# Patient Record
Sex: Female | Born: 1990 | Race: White | Hispanic: No | Marital: Single | Smoking: Never smoker
Health system: Southern US, Community
[De-identification: ages and names within clinical notes are randomized; demographics above are authoritative.]

## PROBLEM LIST (undated history)

## (undated) DIAGNOSIS — D594 Other nonautoimmune hemolytic anemias: Secondary | ICD-10-CM

## (undated) DIAGNOSIS — N390 Urinary tract infection, site not specified: Secondary | ICD-10-CM

## (undated) HISTORY — PX: WISDOM TOOTH EXTRACTION: SHX21

## (undated) HISTORY — DX: Other nonautoimmune hemolytic anemias: D59.4

---
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
## 2011-01-26 NOTE — Assessment & Plan Note (Signed)
[facility]                            [facility] OFFICE NOTE   NAME:Faith Bailey, Faith Bailey                        MRN:          [id number]  DATE:11/22/2006                            DOB:          17-Dec-1990    Tymia is a 20 year old female brought in by her mother for new patient  evaluation and for evaluation of a knot on the left side of her neck for  6 months.   PAST MEDICAL HISTORY:  Past hospitalizations:  None.  Outpatient  surgery:  None.  Illnesses:  None.  Injuries:  She had a fractured left  clavicle x2, a fractured left elbow, all recovered, no sequelae.   ALLERGIES:  None.   She has no smoking, drinking alcohol or using any drugs.  She takes no  medications.   SOCIAL HISTORY:  She is ninth grade at [employer], doing well, A  student.   REVIEW OF SYSTEMS:  She has headaches.  She says they always occur 2-3  days before her period.  She describes them as __________, 3-4 on a  scale of 1 to 10.  She has some photophobia, some phonophobia.  No  nausea, vomiting or diarrhea.  She thought they were tension.  They  actually are migraine.  She has no aura.  Eyes, ears, nose and throat  were negative.  She gets regular dental care.  Currently has braces.  CARDIOPULMONARY:  Negative.  GI:  Negative.  GU:  Negative.  GYN:  Gravida 0, para 0.  Menses are regular, 3-4 days.  Not sexually active.  ENDO:  Negative.  Weight steady.  MUSCULOSKELETAL:  Negative.  VASCULAR:  Negative.  She does have allergic rhinitis, seasonal.  Because she has  light skin and light eyes, I advised her to wear skin screens.   SOCIAL HISTORY:  As above.   FAMILY HISTORY:  Father has had an MI.  He has known  hypercholesterolemia, was a smoker and has high blood pressure.  Mother  is in good health with no problems.   VACCINATION HISTORY:  She is up on her basic shots.  She needs a DTaP  and we will discuss Gardasil.   PROBLEM:  Knot on her neck.  The patient  has noted a knot on the left  side of her neck for 6 months.  It is about the size of a BB, has not  increased in size.  She has, otherwise, been well.   OBJECTIVE:  VITAL SIGNS:  Stable.  She is afebrile.  Examination of the neck shows a BB-sized mass.  It is soft.  It is  movable.  The rest of the lymph nodes are normal.  Thyroid was normal.   IMPRESSION:  1. Benign adenopathy versus cyst, probable cyst because it is outside      the area of the lymph nodes.  Plan:  Advised to observe.  If      increase in size, return.  2. Migraine headaches.  We discussed these are migraine.  We discussed      varied options including Motrin 400-600  mg stat versus Zomig.  She      will try the multiple medications.  I gave her a note to take Zomig      at work p.r.n.  It is 5 mg.  We will give it a 2-3 month trial and      see how that works and we will go from there.  3. Vaccination.  She will be given a DTaP, the Adacel, and we      discussed the pros and cons of Gardasil.  She wants that;      therefore, we gave her her first Gardasil.   Thirty minutes was spent going through her history and discussing the  above issues.     Jeffrey A. Tawanna Cooler, MD  Electronically Signed    JAT/MedQ  DD: 11/25/2006  DT: 11/25/2006  Job #: [id number]

## 2011-03-19 ENCOUNTER — Emergency Department (HOSPITAL_COMMUNITY)
Admission: EM | Admit: 2011-03-19 | Discharge: 2011-03-20 | Disposition: A | Discharge status: Home / Self Care | Payer: Worker's Compensation | Attending: Emergency Medicine | Admitting: Emergency Medicine

## 2011-03-19 ENCOUNTER — Emergency Department (HOSPITAL_COMMUNITY): Payer: Worker's Compensation

## 2011-03-19 DIAGNOSIS — S61509A Unspecified open wound of unspecified wrist, initial encounter: Secondary | ICD-10-CM | POA: Insufficient documentation

## 2011-03-19 DIAGNOSIS — M25539 Pain in unspecified wrist: Secondary | ICD-10-CM | POA: Insufficient documentation

## 2011-03-19 DIAGNOSIS — W260XXA Contact with knife, initial encounter: Secondary | ICD-10-CM | POA: Insufficient documentation

## 2011-03-22 ENCOUNTER — Encounter: Payer: Self-pay | Admitting: Family Medicine

## 2011-03-27 ENCOUNTER — Ambulatory Visit: Payer: 59 | Admitting: Family Medicine

## 2011-07-31 ENCOUNTER — Other Ambulatory Visit: Payer: Self-pay | Admitting: Family Medicine

## 2012-06-25 ENCOUNTER — Ambulatory Visit (INDEPENDENT_AMBULATORY_CARE_PROVIDER_SITE_OTHER): Payer: 59 | Admitting: Family Medicine

## 2012-06-25 ENCOUNTER — Encounter: Payer: Self-pay | Admitting: Family Medicine

## 2012-06-25 VITALS — BP 110/70 | Temp 98.3°F | Ht 69.0 in | Wt 119.0 lb

## 2012-06-25 DIAGNOSIS — Z23 Encounter for immunization: Secondary | ICD-10-CM

## 2012-06-25 DIAGNOSIS — L309 Dermatitis, unspecified: Secondary | ICD-10-CM

## 2012-06-25 DIAGNOSIS — L259 Unspecified contact dermatitis, unspecified cause: Secondary | ICD-10-CM

## 2012-06-25 MED ORDER — TRIAMCINOLONE ACETONIDE 0.025 % EX OINT: TOPICAL_OINTMENT | Freq: Two times a day (BID) | CUTANEOUS | Status: DC

## 2012-06-25 NOTE — Patient Instructions (Signed)
Apply small amounts of the triamcinolone gel twice daily as needed  Return next Tuesday at 8:15 for general checkup no labs

## 2012-06-25 NOTE — Progress Notes (Signed)
  Subjective:    Patient ID: Faith Bailey, female    DOB: 1990-11-16, 21 y.o.   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 She does have a history of underlying allergic rhinitis.   Review of Systems    general and dermatologic review of systems otherwise negative Objective:   Physical Exam  Well-developed well-nourished female no acute distress examination of the hands show some mild dyshidrotic eczema      Assessment & Plan:  Dyshidrotic eczema plan triamcinolone gel twice a day when necessary

## 2012-07-01 ENCOUNTER — Other Ambulatory Visit (HOSPITAL_COMMUNITY)
Admission: RE | Admit: 2012-07-01 | Discharge: 2012-07-01 | Disposition: A | Discharge status: Home / Self Care | Payer: 59 | Source: Ambulatory Visit | Attending: Family Medicine | Admitting: Family Medicine

## 2012-07-01 ENCOUNTER — Encounter: Payer: Self-pay | Admitting: Family Medicine

## 2012-07-01 ENCOUNTER — Ambulatory Visit (INDEPENDENT_AMBULATORY_CARE_PROVIDER_SITE_OTHER): Payer: 59 | Admitting: Family Medicine

## 2012-07-01 VITALS — BP 122/82 | Temp 97.1°F | Ht 69.0 in | Wt 118.0 lb

## 2012-07-01 DIAGNOSIS — L309 Dermatitis, unspecified: Secondary | ICD-10-CM

## 2012-07-01 DIAGNOSIS — Z01419 Encounter for gynecological examination (general) (routine) without abnormal findings: Secondary | ICD-10-CM | POA: Insufficient documentation

## 2012-07-01 DIAGNOSIS — Z0000 Encounter for general adult medical examination without abnormal findings: Secondary | ICD-10-CM

## 2012-07-01 DIAGNOSIS — N6311 Unspecified lump in the right breast, upper outer quadrant: Secondary | ICD-10-CM

## 2012-07-01 DIAGNOSIS — N938 Other specified abnormal uterine and vaginal bleeding: Secondary | ICD-10-CM

## 2012-07-01 DIAGNOSIS — L259 Unspecified contact dermatitis, unspecified cause: Secondary | ICD-10-CM

## 2012-07-01 DIAGNOSIS — J309 Allergic rhinitis, unspecified: Secondary | ICD-10-CM

## 2012-07-01 DIAGNOSIS — N63 Unspecified lump in unspecified breast: Secondary | ICD-10-CM

## 2012-07-01 DIAGNOSIS — N949 Unspecified condition associated with female genital organs and menstrual cycle: Secondary | ICD-10-CM

## 2012-07-01 DIAGNOSIS — Z1151 Encounter for screening for human papillomavirus (HPV): Secondary | ICD-10-CM | POA: Insufficient documentation

## 2012-07-01 DIAGNOSIS — F410 Panic disorder [episodic paroxysmal anxiety] without agoraphobia: Secondary | ICD-10-CM

## 2012-07-01 DIAGNOSIS — R87810 Cervical high risk human papillomavirus (HPV) DNA test positive: Secondary | ICD-10-CM | POA: Insufficient documentation

## 2012-07-01 MED ORDER — LORAZEPAM 0.5 MG PO TABS: 0.5000 mg | ORAL_TABLET | Freq: Two times a day (BID) | ORAL | Status: DC | PRN

## 2012-07-01 NOTE — Progress Notes (Signed)
  Subjective:    Patient ID: Faith Bailey, female    DOB: [birth year], 21 y.o.   MRN: [id number]  HPI Faith Bailey is a 21 yo single female nonsmoker who comes in today for general physical examination  She's always been in excellent health she's had no chronic health problems. She takes Zyrtec and Flonase for allergic rhinitis, Kenalog for eczema and her BCPs.  She has had episodes for about 4 years since her father died with intermittent panic attacks. She describes these as episodic maybe once or twice a month. They began with tightness in the chest shortness of breath and rapid heart rate. No history of cardiac disease no history of panic attacks run in the family.  Her boyfriend is in rehabilitation [facility] hospital 2 months following a accident. She is working here at [employer] in [place]   Review of Systems  Constitutional: Negative.   HENT: Negative.   Eyes: Negative.   Respiratory: Negative.   Cardiovascular: Negative.   Gastrointestinal: Negative.   Genitourinary: Negative.   Musculoskeletal: Negative.   Neurological: Negative.   Hematological: Negative.   Psychiatric/Behavioral: Negative.        Objective:   Physical Exam  Constitutional: She appears well-developed and well-nourished.  HENT:  Head: Normocephalic and atraumatic.  Right Ear: External ear normal.  Left Ear: External ear normal.  Nose: Nose normal.  Mouth/Throat: Oropharynx is clear and moist.  Eyes: EOM are normal. Pupils are equal, round, and reactive to light.  Neck: Normal range of motion. Neck supple. No thyromegaly present.  Cardiovascular: Normal rate, regular rhythm, normal heart sounds and intact distal pulses.  Exam reveals no gallop and no friction rub.   No murmur heard. Pulmonary/Chest: Effort normal and breath sounds normal.  Abdominal: Soft. Bowel sounds are normal. She exhibits no distension and no mass. There is no tenderness. There is no rebound.  Genitourinary: Vagina normal and  uterus normal. Guaiac negative stool. No vaginal discharge found.  Musculoskeletal: Normal range of motion.  Lymphadenopathy:    She has no cervical adenopathy.  Neurological: She is alert. She has normal reflexes. No cranial nerve deficit. She exhibits normal muscle tone. Coordination normal.  Skin: Skin is warm and dry.  Psychiatric: She has a normal mood and affect. Her behavior is normal. Judgment and thought content normal.          Assessment & Plan:  Healthy female  Panic attacks,,,,,,,,,, since they're episodic once or twice a month we'll give her low-dose Ativan  DU B. continue BCPs  Allergic rhinitis continue medication  Eczema continue steroid cream return in one year sooner if any problems

## 2012-07-01 NOTE — Patient Instructions (Signed)
Continue your current medications  Ativan 0.5.................Marland Kitchen 1 at the onset of the panic attack  Return in one year sooner if any problems

## 2012-08-06 ENCOUNTER — Other Ambulatory Visit: Payer: Self-pay | Admitting: Family Medicine

## 2012-11-17 ENCOUNTER — Emergency Department (HOSPITAL_COMMUNITY)
Admission: EM | Admit: 2012-11-17 | Discharge: 2012-11-17 | Disposition: A | Discharge status: Home / Self Care | Payer: 59 | Source: Home / Self Care

## 2012-11-17 ENCOUNTER — Encounter (HOSPITAL_COMMUNITY): Payer: Self-pay

## 2012-11-17 ENCOUNTER — Emergency Department (INDEPENDENT_AMBULATORY_CARE_PROVIDER_SITE_OTHER): Payer: 59

## 2012-11-17 DIAGNOSIS — W19XXXA Unspecified fall, initial encounter: Secondary | ICD-10-CM

## 2012-11-17 DIAGNOSIS — S20229A Contusion of unspecified back wall of thorax, initial encounter: Secondary | ICD-10-CM

## 2012-11-17 DIAGNOSIS — T148XXA Other injury of unspecified body region, initial encounter: Secondary | ICD-10-CM

## 2012-11-17 MED ORDER — CYCLOBENZAPRINE HCL 10 MG PO TABS: 10.0000 mg | ORAL_TABLET | Freq: Three times a day (TID) | ORAL | Status: DC | PRN

## 2012-11-17 MED ORDER — IBUPROFEN 600 MG PO TABS: 600.0000 mg | ORAL_TABLET | Freq: Three times a day (TID) | ORAL | Status: DC

## 2012-11-17 NOTE — ED Notes (Signed)
States that on 3-9, she was in her stocking feet, descending wooden stairs, when she slipped, and fell aprox 5 steps, landed  on her back ; c/o pain in back from sacrum to mid t-spine area, small abrasion present ; denies loc

## 2012-11-17 NOTE — ED Provider Notes (Signed)
History     CSN: [id number]  Arrival date & time 11/17/12  1309   None     Chief Complaint  Patient presents with  . Fall    (Consider location/radiation/quality/duration/timing/severity/associated sxs/prior treatment) HPI Comments: Pt slipped going down wooden stairs while wearing slippery socks, fell backward and landed on stairs on back.  C/o pain to upper and lower back, mostly lower back.   Patient is a 22 y.o. female presenting with fall. The history is provided by the patient.  Fall The accident occurred yesterday. Fall occurred: walking down stairs carrying squirming puppy. She fell from a height of 1 to 2 ft. Impact surface: fell onto stairs. Point of impact: back. Pain location: lower and upper back. The pain is at a severity of 7/10. The pain is moderate. She was ambulatory at the scene. Pertinent negatives include no numbness, no abdominal pain, no bowel incontinence, no vomiting, no loss of consciousness and no tingling. The symptoms are aggravated by activity. She has tried nothing for the symptoms.    Past Medical History  Diagnosis Date  . MHA (microangiopathic hemolytic anemia)     History reviewed. No pertinent past surgical history.  Family History  Problem Relation Age of Onset  . Diabetes Other   . Hypertension Other   . Heart disease Other     History  Substance Use Topics  . Smoking status: Never Smoker   . Smokeless tobacco: Not on file  . Alcohol Use: Not on file    OB History   Grav Para Term Preterm Abortions TAB SAB Ect Mult Living                  Review of Systems  Gastrointestinal: Negative for vomiting, abdominal pain and bowel incontinence.  Musculoskeletal: Negative for gait problem.       Back injury  Skin: Positive for wound.  Neurological: Negative for tingling, loss of consciousness, weakness and numbness.    Allergies  Review of patient's allergies indicates no known allergies.  Home Medications   Current Outpatient  Rx  Name  Route  Sig  Dispense  Refill  . ZOVIA 1/35E, 28, 1-35 MG-MCG tablet      TAKE AS DIRECTED   84 tablet   5   . cetirizine (ZYRTEC) 10 MG tablet   Oral   Take 10 mg by mouth daily.           . cyclobenzaprine (FLEXERIL) 10 MG tablet   Oral   Take 1 tablet (10 mg total) by mouth 3 (three) times daily as needed for muscle spasms.   21 tablet   0   . fluticasone (FLONASE) 50 MCG/ACT nasal spray   Nasal   Place 2 sprays into the nose daily.           Marland Kitchen ibuprofen (ADVIL,MOTRIN) 600 MG tablet   Oral   Take 1 tablet (600 mg total) by mouth 3 (three) times daily.   21 tablet   0   . LORazepam (ATIVAN) 0.5 MG tablet   Oral   Take 1 tablet (0.5 mg total) by mouth 2 (two) times daily as needed for anxiety.   30 tablet   1   . triamcinolone (KENALOG) 0.025 % ointment   Topical   Apply topically 2 (two) times daily.   30 g   3     BP 125/76  Pulse 84  Temp(Src) 97.3 F (36.3 C) (Oral)  Resp 16  SpO2 99%  LMP  11/16/2012  Physical Exam  Constitutional: She appears well-developed and well-nourished. No distress.  Musculoskeletal:       Thoracic back: She exhibits tenderness. She exhibits no bony tenderness, no swelling and no deformity.       Lumbar back: She exhibits tenderness, bony tenderness and swelling.       Back:  Neurological: Coordination and gait normal.  Reflex Scores:      Patellar reflexes are 2+ on the right side and 2+ on the left side. Skin: Skin is warm and dry. Abrasion noted.    ED Course  Procedures (including critical care time)  Labs Reviewed - No data to display Dg Lumbar Spine Complete  11/17/2012  *RADIOLOGY REPORT*  Clinical Data: Larey Seat down steps last night with pain in the lower lumbar spine  LUMBAR SPINE - COMPLETE 4+ VIEW  Comparison: None.  Findings: The lumbar vertebrae are slightly straightened in alignment.  Intervertebral disc spaces appear normal.  No compression deformity is seen.  The SI joints are well corticated.   IMPRESSION: Straightened alignment.  No acute bony abnormality   Original Report Authenticated By: Dwyane Dee, M.D.      1. Fall at home, initial encounter   2. Abrasion   3. Contusion       MDM          Cathlyn Parsons, NP 11/17/12 1552

## 2013-03-11 ENCOUNTER — Encounter (HOSPITAL_COMMUNITY): Payer: Self-pay | Admitting: *Deleted

## 2013-03-11 ENCOUNTER — Emergency Department (HOSPITAL_COMMUNITY)
Admission: EM | Admit: 2013-03-11 | Discharge: 2013-03-11 | Disposition: A | Discharge status: Home / Self Care | Payer: 59 | Source: Home / Self Care | Attending: Family Medicine | Admitting: Family Medicine

## 2013-03-11 DIAGNOSIS — L42 Pityriasis rosea: Secondary | ICD-10-CM

## 2013-03-11 NOTE — ED Notes (Signed)
Pt  Reports    She  Noticed   Crusty  Patches   On abd      And  Back        X  10  Days  The  Rash   Started  Out on  abd  The  Rash  Itches         -  No  Known causative  Agents  Except  Having  Been at the  [facility]

## 2013-03-11 NOTE — ED Provider Notes (Signed)
History    CSN: [id number] Arrival date & time 03/11/13  1028  First MD Initiated Contact with Patient 03/11/13 1241     Chief Complaint  Patient presents with  . Rash   (Consider location/radiation/quality/duration/timing/severity/associated sxs/prior Treatment) Patient is a 22 y.o. female presenting with rash. The history is provided by the patient. No language interpreter was used.  Rash Pain location:  Generalized Pain severity:  No pain Timing:  Constant Progression:  Worsening Chronicity:  New Relieved by:  Nothing Worsened by:  Nothing tried Pt complains of a rash.   It started with one patch on abdomen Past Medical History  Diagnosis Date  . MHA (microangiopathic hemolytic anemia)    History reviewed. No pertinent past surgical history. Family History  Problem Relation Age of Onset  . Diabetes Other   . Hypertension Other   . Heart disease Other    History  Substance Use Topics  . Smoking status: Never Smoker   . Smokeless tobacco: Not on file  . Alcohol Use: No   OB History   Grav Para Term Preterm Abortions TAB SAB Ect Mult Living                 Review of Systems  Skin: Positive for rash.  All other systems reviewed and are negative.    Allergies  Review of patient's allergies indicates no known allergies.  Home Medications   Current Outpatient Rx  Name  Route  Sig  Dispense  Refill  . cetirizine (ZYRTEC) 10 MG tablet   Oral   Take 10 mg by mouth daily.           . cyclobenzaprine (FLEXERIL) 10 MG tablet   Oral   Take 1 tablet (10 mg total) by mouth 3 (three) times daily as needed for muscle spasms.   21 tablet   0   . fluticasone (FLONASE) 50 MCG/ACT nasal spray   Nasal   Place 2 sprays into the nose daily.           Marland Kitchen ibuprofen (ADVIL,MOTRIN) 600 MG tablet   Oral   Take 1 tablet (600 mg total) by mouth 3 (three) times daily.   21 tablet   0   . LORazepam (ATIVAN) 0.5 MG tablet   Oral   Take 1 tablet (0.5 mg total) by  mouth 2 (two) times daily as needed for anxiety.   30 tablet   1   . triamcinolone (KENALOG) 0.025 % ointment   Topical   Apply topically 2 (two) times daily.   30 g   3   . ZOVIA 1/35E, 28, 1-35 MG-MCG tablet      TAKE AS DIRECTED   84 tablet   5    BP 120/64  Pulse 72  Temp(Src) 98.6 F (37 C) (Oral)  Resp 14  SpO2 100%  LMP 02/24/2013 Physical Exam  Nursing note and vitals reviewed. Constitutional: She is oriented to person, place, and time. She appears well-developed and well-nourished.  HENT:  Head: Normocephalic and atraumatic.  Eyes: Pupils are equal, round, and reactive to light.  Musculoskeletal: Normal range of motion.  Neurological: She is alert and oriented to person, place, and time.  Skin: Rash noted.  Herald patch abdomen,  Multiple scattered small erythematous areas back, chest shoulders.  Psychiatric: She has a normal mood and affect.    ED Course  Procedures (including critical care time) Labs Reviewed - No data to display No results found. 1. Pityriasis rosea  MDM    Elson Areas, PA-C 03/11/13 1329  Lonia Skinner [place], PA-C 03/11/13 1331

## 2013-03-11 NOTE — ED Provider Notes (Signed)
Medical screening examination/treatment/procedure(s) were performed by non-physician practitioner and as supervising physician I was immediately available for consultation/collaboration.   MORENO-COLL,Kanin Lia; MD  Perlie Scheuring Moreno-Coll, MD 03/11/13 1716 

## 2013-09-11 ENCOUNTER — Other Ambulatory Visit: Payer: Self-pay | Admitting: Family Medicine

## 2013-10-05 ENCOUNTER — Other Ambulatory Visit: Payer: Self-pay | Admitting: Family Medicine

## 2013-11-13 ENCOUNTER — Other Ambulatory Visit (INDEPENDENT_AMBULATORY_CARE_PROVIDER_SITE_OTHER): Payer: 59

## 2013-11-13 DIAGNOSIS — Z0000 Encounter for general adult medical examination without abnormal findings: Secondary | ICD-10-CM

## 2013-11-13 LAB — LIPID PANEL
CHOL/HDL RATIO: 3
CHOLESTEROL: 153 mg/dL (ref 0–200)
HDL: 52.6 mg/dL (ref >39.00)
LDL Cholesterol: 86 mg/dL (ref 0–99)
TRIGLYCERIDES: 73 mg/dL (ref 0.0–149.0)
VLDL: 14.6 mg/dL (ref 0.0–40.0)

## 2013-11-13 LAB — POCT URINALYSIS DIPSTICK
Bilirubin, UA: NEGATIVE
GLUCOSE UA: NEGATIVE
Ketones, UA: NEGATIVE
NITRITE UA: NEGATIVE
PROTEIN UA: NEGATIVE
SPEC GRAV UA: 1.025
Urobilinogen, UA: 0.2
pH, UA: 5.5

## 2013-11-13 LAB — CBC WITH DIFFERENTIAL/PLATELET
BASOS ABS: 0 10*3/uL (ref 0.0–0.1)
Basophils Relative: 0.9 % (ref 0.0–3.0)
EOS ABS: 0.3 10*3/uL (ref 0.0–0.7)
EOS PCT: 5 % (ref 0.0–5.0)
HEMATOCRIT: 43.2 % (ref 36.0–46.0)
Hemoglobin: 14.9 g/dL (ref 12.0–15.0)
LYMPHS ABS: 1.6 10*3/uL (ref 0.7–4.0)
Lymphocytes Relative: 32.3 % (ref 12.0–46.0)
MCHC: 34.4 g/dL (ref 30.0–36.0)
MCV: 93.7 fl (ref 78.0–100.0)
Monocytes Absolute: 0.4 10*3/uL (ref 0.1–1.0)
Monocytes Relative: 8.2 % (ref 3.0–12.0)
NEUTROS PCT: 53.6 % (ref 43.0–77.0)
Neutro Abs: 2.7 10*3/uL (ref 1.4–7.7)
Platelets: 206 10*3/uL (ref 150.0–400.0)
RBC: 4.62 Mil/uL (ref 3.87–5.11)
RDW: 12.3 % (ref 11.5–14.6)
WBC: 5 10*3/uL (ref 4.5–10.5)

## 2013-11-13 LAB — BASIC METABOLIC PANEL
BUN: 14 mg/dL (ref 6–23)
CO2: 27 meq/L (ref 19–32)
CREATININE: 0.8 mg/dL (ref 0.4–1.2)
Calcium: 9.3 mg/dL (ref 8.4–10.5)
Chloride: 105 mEq/L (ref 96–112)
GFR: 102.58 mL/min (ref >60.00)
Glucose, Bld: 86 mg/dL (ref 70–99)
Potassium: 4.4 mEq/L (ref 3.5–5.1)
Sodium: 138 mEq/L (ref 135–145)

## 2013-11-13 LAB — HEPATIC FUNCTION PANEL
ALK PHOS: 50 U/L (ref 39–117)
ALT: 27 U/L (ref 0–35)
AST: 21 U/L (ref 0–37)
Albumin: 4.4 g/dL (ref 3.5–5.2)
BILIRUBIN TOTAL: 1.1 mg/dL (ref 0.3–1.2)
Bilirubin, Direct: 0.1 mg/dL (ref 0.0–0.3)
Total Protein: 7.3 g/dL (ref 6.0–8.3)

## 2013-11-13 LAB — TSH: TSH: 2.09 u[IU]/mL (ref 0.35–5.50)

## 2013-11-19 ENCOUNTER — Ambulatory Visit (INDEPENDENT_AMBULATORY_CARE_PROVIDER_SITE_OTHER): Payer: 59 | Admitting: Family Medicine

## 2013-11-19 ENCOUNTER — Encounter: Payer: Self-pay | Admitting: Family Medicine

## 2013-11-19 ENCOUNTER — Other Ambulatory Visit (HOSPITAL_COMMUNITY)
Admission: RE | Admit: 2013-11-19 | Discharge: 2013-11-19 | Disposition: A | Discharge status: Home / Self Care | Payer: 59 | Source: Ambulatory Visit | Attending: Family Medicine | Admitting: Family Medicine

## 2013-11-19 VITALS — BP 118/70 | HR 96 | Temp 98.1°F | Resp 18 | Ht 69.0 in | Wt 120.0 lb

## 2013-11-19 DIAGNOSIS — N925 Other specified irregular menstruation: Secondary | ICD-10-CM

## 2013-11-19 DIAGNOSIS — Z01419 Encounter for gynecological examination (general) (routine) without abnormal findings: Secondary | ICD-10-CM | POA: Insufficient documentation

## 2013-11-19 DIAGNOSIS — N949 Unspecified condition associated with female genital organs and menstrual cycle: Secondary | ICD-10-CM

## 2013-11-19 DIAGNOSIS — J309 Allergic rhinitis, unspecified: Secondary | ICD-10-CM

## 2013-11-19 DIAGNOSIS — Z124 Encounter for screening for malignant neoplasm of cervix: Secondary | ICD-10-CM

## 2013-11-19 DIAGNOSIS — N938 Other specified abnormal uterine and vaginal bleeding: Secondary | ICD-10-CM

## 2013-11-19 MED ORDER — ETHYNODIOL DIAC-ETH ESTRADIOL 1-35 MG-MCG PO TABS: ORAL_TABLET | ORAL | Status: DC

## 2013-11-19 NOTE — Progress Notes (Signed)
Pre-visit discussion using our clinic review tool. No additional management support is needed unless otherwise documented below in the visit note.  

## 2013-11-19 NOTE — Patient Instructions (Signed)
Remember to do a thorough breast exam monthly  Return in one year sooner if any problems  Continue your current medications

## 2013-11-19 NOTE — Progress Notes (Signed)
   Subjective:    Patient ID: Faith Bailey, female    DOB: 28-Sep-1990, 23 y.o.   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  She takes Zyrtec amikacin steroid nasal spray for allergic rhinitis  Her last year's physical he notices a cystic lesion left breast at 6:00. She thinks it's a little bigger and it tends to say sore a lot.   Review of Systems  Constitutional: Negative.   HENT: Negative.   Eyes: Negative.   Respiratory: Negative.   Cardiovascular: Negative.   Gastrointestinal: Negative.   Genitourinary: Negative.   Musculoskeletal: Negative.   Neurological: Negative.   Psychiatric/Behavioral: Negative.        Objective:   Physical Exam  Nursing note and vitals reviewed. Constitutional: She appears well-developed and well-nourished.  HENT:  Head: Normocephalic and atraumatic.  Right Ear: External ear normal.  Left Ear: External ear normal.  Nose: Nose normal.  Mouth/Throat: Oropharynx is clear and moist.  Eyes: EOM are normal. Pupils are equal, round, and reactive to light.  Neck: Normal range of motion. Neck supple. No thyromegaly present.  Cardiovascular: Normal rate, regular rhythm, normal heart sounds and intact distal pulses.  Exam reveals no gallop and no friction rub.   No murmur heard. Pulmonary/Chest: Effort normal and breath sounds normal.  Abdominal: Soft. Bowel sounds are normal. She exhibits no distension and no mass. There is no tenderness. There is no rebound.  Genitourinary: Vagina normal and uterus normal. Guaiac negative stool. No vaginal discharge found.  Bilateral breast exam shows both nipples are chronically inverted. There is a marble-sized lesion left breast 6:00 half an inch below the nipple. It soft rubbery and movable and tender  Musculoskeletal: Normal range of motion.    Lymphadenopathy:    She has no cervical adenopathy.  Neurological: She is alert. She has normal reflexes. No cranial nerve deficit. She exhibits normal muscle tone. Coordination normal.  Skin: Skin is warm and dry.  Psychiatric: She has a normal mood and affect. Her behavior is normal. Judgment and thought content normal.          Assessment & Plan:  Healthy female  Dysfunction uterine bleeding continue BCPs  Allergic rhinitis continue Zyrtec  Cystic lesion left breast observe

## 2014-11-01 ENCOUNTER — Ambulatory Visit: Payer: Self-pay | Admitting: Family Medicine

## 2014-11-09 ENCOUNTER — Other Ambulatory Visit: Payer: Self-pay | Admitting: Family Medicine

## 2014-11-11 ENCOUNTER — Other Ambulatory Visit: Payer: Self-pay | Admitting: Family Medicine

## 2014-11-12 MED ORDER — ETHYNODIOL DIAC-ETH ESTRADIOL 1-35 MG-MCG PO TABS: ORAL_TABLET | ORAL | Status: DC

## 2015-05-09 ENCOUNTER — Encounter (HOSPITAL_COMMUNITY): Payer: Self-pay | Admitting: *Deleted

## 2015-05-09 ENCOUNTER — Inpatient Hospital Stay (HOSPITAL_COMMUNITY)
Admission: AD | Admit: 2015-05-09 | Discharge: 2015-05-09 | Disposition: A | Discharge status: Home / Self Care | Payer: BLUE CROSS/BLUE SHIELD | Source: Ambulatory Visit | Attending: Family Medicine | Admitting: Family Medicine

## 2015-05-09 ENCOUNTER — Inpatient Hospital Stay (HOSPITAL_COMMUNITY): Payer: BLUE CROSS/BLUE SHIELD

## 2015-05-09 DIAGNOSIS — Z833 Family history of diabetes mellitus: Secondary | ICD-10-CM | POA: Insufficient documentation

## 2015-05-09 DIAGNOSIS — R1031 Right lower quadrant pain: Secondary | ICD-10-CM | POA: Diagnosis present

## 2015-05-09 DIAGNOSIS — Z8249 Family history of ischemic heart disease and other diseases of the circulatory system: Secondary | ICD-10-CM | POA: Diagnosis not present

## 2015-05-09 DIAGNOSIS — N8320 Unspecified ovarian cysts: Secondary | ICD-10-CM | POA: Insufficient documentation

## 2015-05-09 DIAGNOSIS — K5900 Constipation, unspecified: Secondary | ICD-10-CM | POA: Diagnosis not present

## 2015-05-09 DIAGNOSIS — N83202 Unspecified ovarian cyst, left side: Secondary | ICD-10-CM

## 2015-05-09 DIAGNOSIS — R109 Unspecified abdominal pain: Secondary | ICD-10-CM

## 2015-05-09 HISTORY — DX: Urinary tract infection, site not specified: N39.0

## 2015-05-09 LAB — COMPREHENSIVE METABOLIC PANEL
ALK PHOS: 33 U/L — AB (ref 38–126)
ALT: 12 U/L — AB (ref 14–54)
AST: 17 U/L (ref 15–41)
Albumin: 3.6 g/dL (ref 3.5–5.0)
Anion gap: 7 (ref 5–15)
BUN: 11 mg/dL (ref 6–20)
CALCIUM: 8.6 mg/dL — AB (ref 8.9–10.3)
CO2: 22 mmol/L (ref 22–32)
CREATININE: 0.7 mg/dL (ref 0.44–1.00)
Chloride: 109 mmol/L (ref 101–111)
Glucose, Bld: 79 mg/dL (ref 65–99)
Potassium: 3.9 mmol/L (ref 3.5–5.1)
Sodium: 138 mmol/L (ref 135–145)
TOTAL PROTEIN: 6.4 g/dL — AB (ref 6.5–8.1)
Total Bilirubin: 0.6 mg/dL (ref 0.3–1.2)

## 2015-05-09 LAB — CBC
HCT: 36.7 % (ref 36.0–46.0)
HEMOGLOBIN: 12.9 g/dL (ref 12.0–15.0)
MCH: 32.9 pg (ref 26.0–34.0)
MCHC: 35.1 g/dL (ref 30.0–36.0)
MCV: 93.6 fL (ref 78.0–100.0)
Platelets: 164 10*3/uL (ref 150–400)
RBC: 3.92 MIL/uL (ref 3.87–5.11)
RDW: 12.5 % (ref 11.5–15.5)
WBC: 4.8 10*3/uL (ref 4.0–10.5)

## 2015-05-09 LAB — URINALYSIS, ROUTINE W REFLEX MICROSCOPIC
BILIRUBIN URINE: NEGATIVE
Glucose, UA: NEGATIVE mg/dL
HGB URINE DIPSTICK: NEGATIVE
Ketones, ur: 15 mg/dL — AB
Leukocytes, UA: NEGATIVE
Nitrite: NEGATIVE
PH: 5.5 (ref 5.0–8.0)
Protein, ur: NEGATIVE mg/dL
SPECIFIC GRAVITY, URINE: 1.02 (ref 1.005–1.030)
Urobilinogen, UA: 0.2 mg/dL (ref 0.0–1.0)

## 2015-05-09 LAB — POCT PREGNANCY, URINE: Preg Test, Ur: NEGATIVE

## 2015-05-09 MED ORDER — KETOROLAC TROMETHAMINE 60 MG/2ML IM SOLN
60.0000 mg | Freq: Once | INTRAMUSCULAR | Status: AC
  Administered 2015-05-09: 60 mg via INTRAMUSCULAR
  Filled 2015-05-09: qty 2

## 2015-05-09 MED ORDER — IBUPROFEN 600 MG PO TABS: 600.0000 mg | ORAL_TABLET | Freq: Four times a day (QID) | ORAL | Status: DC | PRN

## 2015-05-09 NOTE — MAU Note (Signed)
Pt states she was dx'd with ovarian cyst in [facility], was given phenergan & oxycodone.  Last took oxycodone last night.

## 2015-05-09 NOTE — MAU Provider Note (Signed)
History     CSN: [id number]  Arrival date and time: 05/09/15 4098   First Provider Initiated Contact with Patient 05/09/15 0813       Chief Complaint  Patient presents with  . Abdominal Pain   HPI  Faith Bailey is a 24 y.o. nonpregnant female patient who presents with abdominal pain. Pain started last Thursday while at [facility]; was seen at hospital there and diagnosed with ovarian cyst.  Pain worsened last night. RLQ pain that is sharp, lower suprapubic pain that is burning and dull low back pain. Rates pain 8/10. Took Norco last night (prescribed by other hospital) with minimal relief.  Denies vaginal bleeding or vaginal discharge. Denies fever.  Some nausea, no vomiting.  Last BM was last Thursday; has been taking OTC stool softener with no relief.   Past Medical History  Diagnosis Date  . MHA (microangiopathic hemolytic anemia)   . UTI (urinary tract infection)     Past Surgical History  Procedure Laterality Date  . Wisdom tooth extraction      Family History  Problem Relation Age of Onset  . Diabetes Other   . Hypertension Other   . Heart disease Other     Social History  Substance Use Topics  . Smoking status: Never Smoker   . Smokeless tobacco: None  . Alcohol Use: Yes     Comment: occasional    Allergies: No Known Allergies  Prescriptions prior to admission  Medication Sig Dispense Refill Last Dose  . cetirizine (ZYRTEC) 10 MG tablet Take 10 mg by mouth daily.     Taking  . ethynodiol-ethinyl estradiol (ZOVIA 1/35E, 28,) 1-35 MG-MCG tablet One tab daily.   Office visit for more refills 84 tablet 0   . fluticasone (FLONASE) 50 MCG/ACT nasal spray Place 2 sprays into the nose daily.     Taking  . ibuprofen (ADVIL,MOTRIN) 600 MG tablet Take 1 tablet (600 mg total) by mouth 3 (three) times daily. 21 tablet 0 Taking  . triamcinolone (KENALOG) 0.025 % ointment Apply topically 2 (two) times daily. 30 g 3 Taking    Review of Systems  Constitutional:  Negative.  Negative for fever.  Gastrointestinal: Positive for nausea, abdominal pain and constipation. Negative for vomiting and diarrhea.  Genitourinary: Negative for dysuria.       Negative for vaginal bleeding & vaginal discharge.  Positive for urinary hesitancy   Physical Exam   Blood pressure 130/71, pulse 71, temperature 97.3 F (36.3 C), temperature source Oral, resp. rate 18, last menstrual period 04/19/2015.  Physical Exam  Nursing note and vitals reviewed. Constitutional: She is oriented to person, place, and time. She appears well-developed and well-nourished. No distress.  HENT:  Head: Normocephalic and atraumatic.  Eyes: Conjunctivae are normal. Right eye exhibits no discharge. Left eye exhibits no discharge. No scleral icterus.  Neck: Normal range of motion.  Cardiovascular: Normal rate, regular rhythm and normal heart sounds.   No murmur heard. Respiratory: Effort normal and breath sounds normal. No respiratory distress. She has no wheezes.  GI: Soft. Bowel sounds are normal. She exhibits no distension. There is tenderness in the right lower quadrant, suprapubic area and left lower quadrant. There is no rebound and no guarding.  Neurological: She is alert and oriented to person, place, and time.  Skin: Skin is warm and dry. She is not diaphoretic.  Psychiatric: She has a normal mood and affect. Her behavior is normal. Judgment and thought content normal.    MAU Course  Procedures Results for orders placed or performed during the hospital encounter of 05/09/15 (from the past 24 hour(s))  Urinalysis, Routine w reflex microscopic (not at [facility])     Status: Abnormal   Collection Time: 05/09/15  8:11 AM  Result Value Ref Range   Color, Urine YELLOW YELLOW   APPearance CLEAR CLEAR   Specific Gravity, Urine 1.020 1.005 - 1.030   pH 5.5 5.0 - 8.0   Glucose, UA NEGATIVE NEGATIVE mg/dL   Hgb urine dipstick NEGATIVE NEGATIVE   Bilirubin Urine NEGATIVE NEGATIVE   Ketones,  ur 15 (A) NEGATIVE mg/dL   Protein, ur NEGATIVE NEGATIVE mg/dL   Urobilinogen, UA 0.2 0.0 - 1.0 mg/dL   Nitrite NEGATIVE NEGATIVE   Leukocytes, UA NEGATIVE NEGATIVE  Pregnancy, urine POC     Status: None   Collection Time: 05/09/15  8:14 AM  Result Value Ref Range   Preg Test, Ur NEGATIVE NEGATIVE  CBC     Status: None   Collection Time: 05/09/15  8:31 AM  Result Value Ref Range   WBC 4.8 4.0 - 10.5 K/uL   RBC 3.92 3.87 - 5.11 MIL/uL   Hemoglobin 12.9 12.0 - 15.0 g/dL   HCT 16.1 09.6 - 04.5 %   MCV 93.6 78.0 - 100.0 fL   MCH 32.9 26.0 - 34.0 pg   MCHC 35.1 30.0 - 36.0 g/dL   RDW 40.9 81.1 - 91.4 %   Platelets 164 150 - 400 K/uL  Comprehensive metabolic panel     Status: Abnormal   Collection Time: 05/09/15  8:31 AM  Result Value Ref Range   Sodium 138 135 - 145 mmol/L   Potassium 3.9 3.5 - 5.1 mmol/L   Chloride 109 101 - 111 mmol/L   CO2 22 22 - 32 mmol/L   Glucose, Bld 79 65 - 99 mg/dL   BUN 11 6 - 20 mg/dL   Creatinine, Ser 7.82 0.44 - 1.00 mg/dL   Calcium 8.6 (L) 8.9 - 10.3 mg/dL   Total Protein 6.4 (L) 6.5 - 8.1 g/dL   Albumin 3.6 3.5 - 5.0 g/dL   AST 17 15 - 41 U/L   ALT 12 (L) 14 - 54 U/L   Alkaline Phosphatase 33 (L) 38 - 126 U/L   Total Bilirubin 0.6 0.3 - 1.2 mg/dL   GFR calc non Af Amer >60 >60 mL/min   GFR calc Af Amer >60 >60 mL/min   Anion gap 7 5 - 15   US Transvaginal Non-ob  05/09/2015   CLINICAL DATA:  Abdominal pain.  EXAM: TRANSABDOMINAL AND TRANSVAGINAL ULTRASOUND OF PELVIS  DOPPLER ULTRASOUND OF OVARIES  TECHNIQUE: Both transabdominal and transvaginal ultrasound examinations of the pelvis were performed. Transabdominal technique was performed for global imaging of the pelvis including uterus, ovaries, adnexal regions, and pelvic cul-de-sac.  It was necessary to proceed with endovaginal exam following the transabdominal exam to visualize the endometrium and ovaries. Color and duplex Doppler ultrasound was utilized to evaluate blood flow to the ovaries.   COMPARISON:  None.  FINDINGS: Uterus  Measurements: 7.5 x 3.2 x 6.5 cm. No fibroids or other mass visualized.  Endometrium  Thickness: 4 mm.  No focal abnormality visualized.  Right ovary  Measurements: 3.9 x 2.1 x 1.8 cm. Normal appearance/no adnexal mass.  Left ovary  Measurements: 5.7 x 3.1 x 2.7 cm. There is a 2.4 x 1.9 x 2.6 cm subtle left ovarian mass which is isoechoic to the remainder of the ovarian parenchyma without internal Doppler flow  weight may reflect a hemorrhagic cyst or endometrioma.  Pulsed Doppler evaluation of both ovaries demonstrates normal low-resistance arterial and venous waveforms.  Other findings  Trace pelvic free fluid likely physiologic.  IMPRESSION: 1. No ovarian torsion. 2. There is a 2.4 x 1.9 x 2.6 cm subtle left ovarian mass which is isoechoic to the remainder of the ovarian parenchyma without internal Doppler flow weight may reflect a hemorrhagic cyst or endometrioma. Recommend follow-up pelvic ultrasound in 6-8 weeks.   Electronically Signed   By: Elige Ko   On: 05/09/2015 09:55   US Pelvis Complete  05/09/2015   CLINICAL DATA:  Abdominal pain.  EXAM: TRANSABDOMINAL AND TRANSVAGINAL ULTRASOUND OF PELVIS  DOPPLER ULTRASOUND OF OVARIES  TECHNIQUE: Both transabdominal and transvaginal ultrasound examinations of the pelvis were performed. Transabdominal technique was performed for global imaging of the pelvis including uterus, ovaries, adnexal regions, and pelvic cul-de-sac.  It was necessary to proceed with endovaginal exam following the transabdominal exam to visualize the endometrium and ovaries. Color and duplex Doppler ultrasound was utilized to evaluate blood flow to the ovaries.  COMPARISON:  None.  FINDINGS: Uterus  Measurements: 7.5 x 3.2 x 6.5 cm. No fibroids or other mass visualized.  Endometrium  Thickness: 4 mm.  No focal abnormality visualized.  Right ovary  Measurements: 3.9 x 2.1 x 1.8 cm. Normal appearance/no adnexal mass.  Left ovary  Measurements: 5.7 x 3.1  x 2.7 cm. There is a 2.4 x 1.9 x 2.6 cm subtle left ovarian mass which is isoechoic to the remainder of the ovarian parenchyma without internal Doppler flow weight may reflect a hemorrhagic cyst or endometrioma.  Pulsed Doppler evaluation of both ovaries demonstrates normal low-resistance arterial and venous waveforms.  Other findings  Trace pelvic free fluid likely physiologic.  IMPRESSION: 1. No ovarian torsion. 2. There is a 2.4 x 1.9 x 2.6 cm subtle left ovarian mass which is isoechoic to the remainder of the ovarian parenchyma without internal Doppler flow weight may reflect a hemorrhagic cyst or endometrioma. Recommend follow-up pelvic ultrasound in 6-8 weeks.   Electronically Signed   By: Elige Ko   On: 05/09/2015 09:55   [place] Art/ven Flow Abd Pelv Doppler  05/09/2015   CLINICAL DATA:  Abdominal pain.  EXAM: TRANSABDOMINAL AND TRANSVAGINAL ULTRASOUND OF PELVIS  DOPPLER ULTRASOUND OF OVARIES  TECHNIQUE: Both transabdominal and transvaginal ultrasound examinations of the pelvis were performed. Transabdominal technique was performed for global imaging of the pelvis including uterus, ovaries, adnexal regions, and pelvic cul-de-sac.  It was necessary to proceed with endovaginal exam following the transabdominal exam to visualize the endometrium and ovaries. Color and duplex Doppler ultrasound was utilized to evaluate blood flow to the ovaries.  COMPARISON:  None.  FINDINGS: Uterus  Measurements: 7.5 x 3.2 x 6.5 cm. No fibroids or other mass visualized.  Endometrium  Thickness: 4 mm.  No focal abnormality visualized.  Right ovary  Measurements: 3.9 x 2.1 x 1.8 cm. Normal appearance/no adnexal mass.  Left ovary  Measurements: 5.7 x 3.1 x 2.7 cm. There is a 2.4 x 1.9 x 2.6 cm subtle left ovarian mass which is isoechoic to the remainder of the ovarian parenchyma without internal Doppler flow weight may reflect a hemorrhagic cyst or endometrioma.  Pulsed Doppler evaluation of both ovaries demonstrates normal  low-resistance arterial and venous waveforms.  Other findings  Trace pelvic free fluid likely physiologic.  IMPRESSION: 1. No ovarian torsion. 2. There is a 2.4 x 1.9 x 2.6 cm subtle left ovarian  mass which is isoechoic to the remainder of the ovarian parenchyma without internal Doppler flow weight may reflect a hemorrhagic cyst or endometrioma. Recommend follow-up pelvic ultrasound in 6-8 weeks.   Electronically Signed   By: Elige Ko   On: 05/09/2015 09:55     MDM UPT negative CBC/CMP Pelvic ultrasound Toradol 60mg  IM- pain improved  Assessment and Plan  A: 1. Abdominal pain in female   2. Cyst of left ovary   3. Constipation, unspecified constipation type     P: Discharge home in stable condition Return to PCP with recommendation for f/u ultrasound in 6-8wks D/c Norco; take laxative as needed to resume normal BMs Rx ibuprofen 600mg  prn pain Discussed reasons to return to MAU  Judeth Horn, NP  05/09/2015, 8:13 AM

## 2015-05-09 NOTE — Discharge Instructions (Signed)
Ovarian Cyst An ovarian cyst is a fluid-filled sac that forms on an ovary. The ovaries are small organs that produce eggs in women. Various types of cysts can form on the ovaries. Most are not cancerous. Many do not cause problems, and they often go away on their own. Some may cause symptoms and require treatment. Common types of ovarian cysts include:  Functional cysts--These cysts may occur every month during the menstrual cycle. This is normal. The cysts usually go away with the next menstrual cycle if the woman does not get pregnant. Usually, there are no symptoms with a functional cyst.  Endometrioma cysts--These cysts form from the tissue that lines the uterus. They are also called "chocolate cysts" because they become filled with blood that turns brown. This type of cyst can cause pain in the lower abdomen during intercourse and with your menstrual period.  Cystadenoma cysts--This type develops from the cells on the outside of the ovary. These cysts can get very big and cause lower abdomen pain and pain with intercourse. This type of cyst can twist on itself, cut off its blood supply, and cause severe pain. It can also easily rupture and cause a lot of pain.  Dermoid cysts--This type of cyst is sometimes found in both ovaries. These cysts may contain different kinds of body tissue, such as skin, teeth, hair, or cartilage. They usually do not cause symptoms unless they get very big.  Theca lutein cysts--These cysts occur when too much of a certain hormone (human chorionic gonadotropin) is produced and overstimulates the ovaries to produce an egg. This is most common after procedures used to assist with the conception of a baby (in vitro fertilization). CAUSES   Fertility drugs can cause a condition in which multiple large cysts are formed on the ovaries. This is called ovarian hyperstimulation syndrome.  A condition called polycystic ovary syndrome can cause hormonal imbalances that can lead to  nonfunctional ovarian cysts. SIGNS AND SYMPTOMS  Many ovarian cysts do not cause symptoms. If symptoms are present, they may include:  Pelvic pain or pressure.  Pain in the lower abdomen.  Pain during sexual intercourse.  Increasing girth (swelling) of the abdomen.  Abnormal menstrual periods.  Increasing pain with menstrual periods.  Stopping having menstrual periods without being pregnant. DIAGNOSIS  These cysts are commonly found during a routine or annual pelvic exam. Tests may be ordered to find out more about the cyst. These tests may include:  Ultrasound.  X-ray of the pelvis.  CT scan.  MRI.  Blood tests. TREATMENT  Many ovarian cysts go away on their own without treatment. Your health care provider may want to check your cyst regularly for 2-3 months to see if it changes. For women in menopause, it is particularly important to monitor a cyst closely because of the higher rate of ovarian cancer in menopausal women. When treatment is needed, it may include any of the following:  A procedure to drain the cyst (aspiration). This may be done using a long needle and ultrasound. It can also be done through a laparoscopic procedure. This involves using a thin, lighted tube with a tiny camera on the end (laparoscope) inserted through a small incision.  Surgery to remove the whole cyst. This may be done using laparoscopic surgery or an open surgery involving a larger incision in the lower abdomen.  Hormone treatment or birth control pills. These methods are sometimes used to help dissolve a cyst. HOME CARE INSTRUCTIONS   Only take over-the-counter  or prescription medicines as directed by your health care provider.  Follow up with your health care provider as directed.  Get regular pelvic exams and Pap tests. SEEK MEDICAL CARE IF:   Your periods are late, irregular, or painful, or they stop.  Your pelvic pain or abdominal pain does not go away.  Your abdomen becomes  larger or swollen.  You have pressure on your bladder or trouble emptying your bladder completely.  You have pain during sexual intercourse.  You have feelings of fullness, pressure, or discomfort in your stomach.  You lose weight for no apparent reason.  You feel generally ill.  You become constipated.  You lose your appetite.  You develop acne.  You have an increase in body and facial hair.  You are gaining weight, without changing your exercise and eating habits.  You think you are pregnant. SEEK IMMEDIATE MEDICAL CARE IF:   You have increasing abdominal pain.  You feel sick to your stomach (nauseous), and you throw up (vomit).  You develop a fever that comes on suddenly.  You have abdominal pain during a bowel movement.  Your menstrual periods become heavier than usual. MAKE SURE YOU:  Understand these instructions.  Will watch your condition.  Will get help right away if you are not doing well or get worse. Document Released: 08/27/2005 Document Revised: 09/01/2013 Document Reviewed: 05/04/2013 [facility] Patient Information 2015 [place], [place]. This information is not intended to replace advice given to you by your health care provider. Make sure you discuss any questions you have with your health care provider.   Constipation Constipation is when a person:  Poops (has a bowel movement) less than 3 times a week.  Has a hard time pooping.  Has poop that is dry, hard, or bigger than normal. HOME CARE   Eat foods with a lot of fiber in them. This includes fruits, vegetables, beans, and whole grains such as brown rice.  Avoid fatty foods and foods with a lot of sugar. This includes french fries, hamburgers, cookies, candy, and soda.  If you are not getting enough fiber from food, take products with added fiber in them (supplements).  Drink enough fluid to keep your pee (urine) clear or pale yellow.  Exercise on a regular basis, or as told by your  doctor.  Go to the restroom when you feel like you need to poop. Do not hold it.  Only take medicine as told by your doctor. Do not take medicines that help you poop (laxatives) without talking to your doctor first. GET HELP RIGHT AWAY IF:   You have bright red blood in your poop (stool).  Your constipation lasts more than 4 days or gets worse.  You have belly (abdominal) or butt (rectal) pain.  You have thin poop (as thin as a pencil).  You lose weight, and it cannot be explained. MAKE SURE YOU:   Understand these instructions.  Will watch your condition.  Will get help right away if you are not doing well or get worse. Document Released: 02/13/2008 Document Revised: 09/01/2013 Document Reviewed: 06/08/2013 [facility] Patient Information 2015 [place], [place]. This information is not intended to replace advice given to you by your health care provider. Make sure you discuss any questions you have with your health care provider.

## 2015-05-09 NOTE — MAU Note (Signed)
was seen on Thurs, dx with ovarian cyst.  The pain has gotten worse, now right and left side, low back and left groin into leg.

## 2016-12-25 IMAGING — US US ART/VEN ABD/PELV/SCROTUM DOPPLER LTD
1 series · 15 of 25 positions shown · non-contrast
Comparison: None.

CLINICAL DATA: Abdominal pain.

EXAM:
TRANSABDOMINAL AND TRANSVAGINAL ULTRASOUND OF PELVIS
DOPPLER ULTRASOUND OF OVARIES
TECHNIQUE: Both transabdominal and transvaginal ultrasound examinations of the
pelvis were performed. Transabdominal technique was performed for
global imaging of the pelvis including uterus, ovaries, adnexal
regions, and pelvic cul-de-sac.
It was necessary to proceed with endovaginal exam following the
transabdominal exam to visualize the endometrium and ovaries. Color
and duplex Doppler ultrasound was utilized to evaluate blood flow to
the ovaries.

[Series 1: us art/ven abd/pelv/scrotum doppler ltd · 15 of 70 slices shown]
[im 1/70]
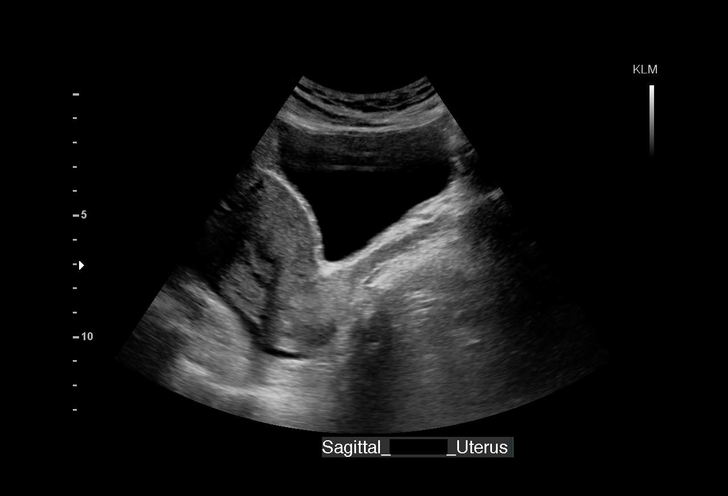
[im 6/70]
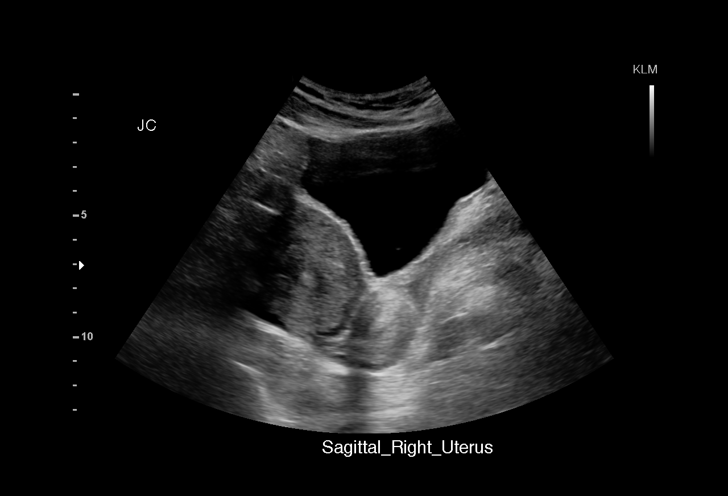
[im 12/70]
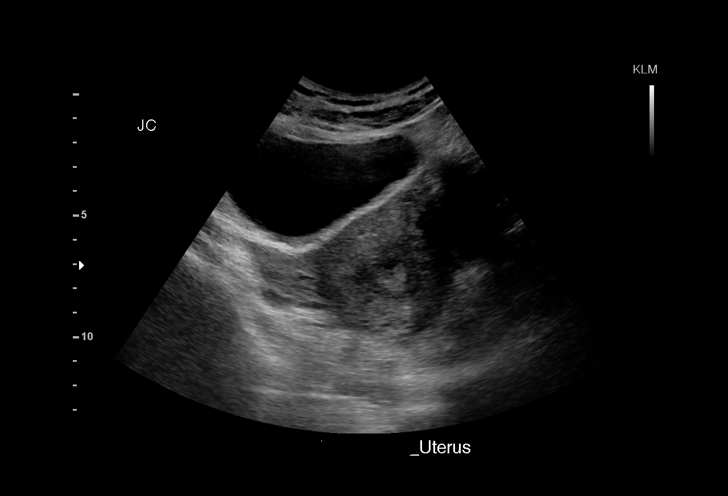
[im 15/70]
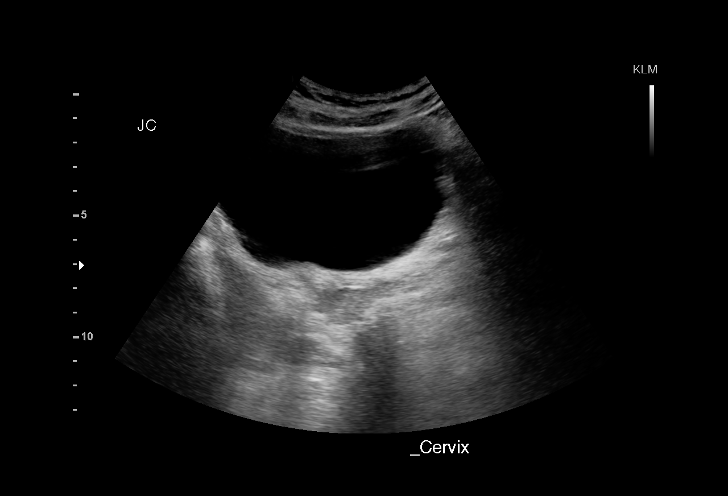
[im 21/70]
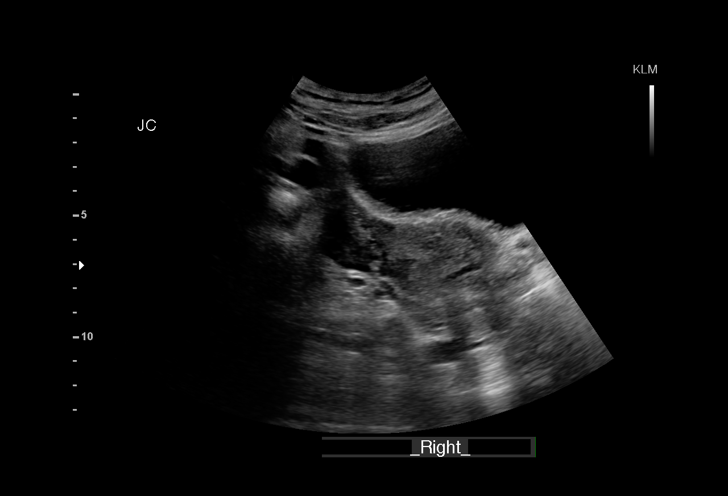
[im 26/70]
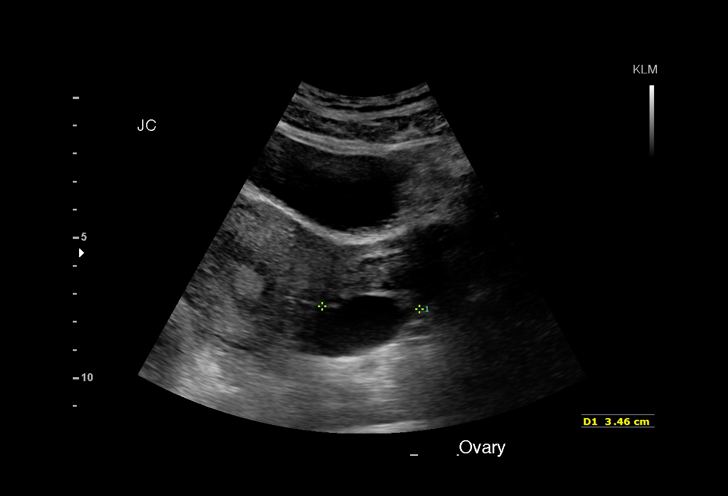
[im 29/70]
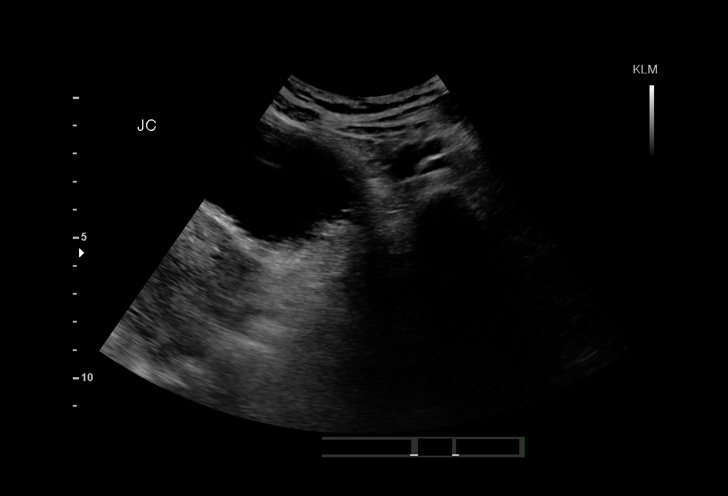
[im 35/70]
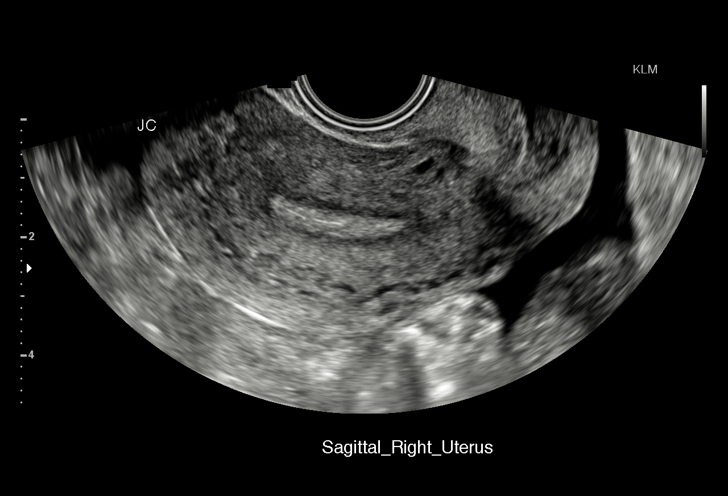
[im 41/70]
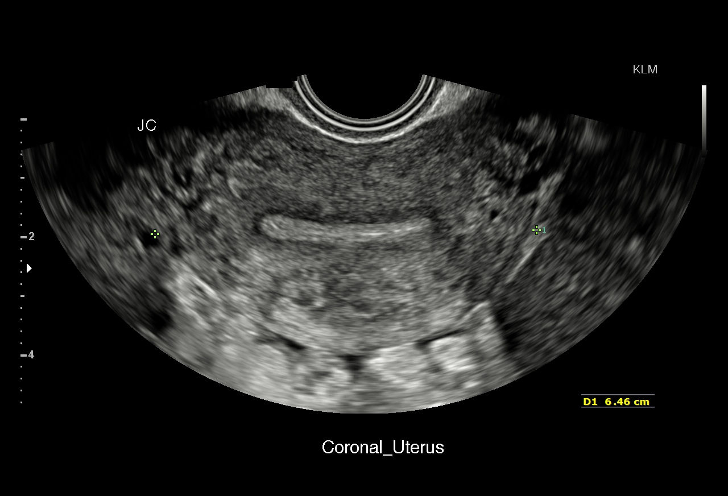
[im 44/70]
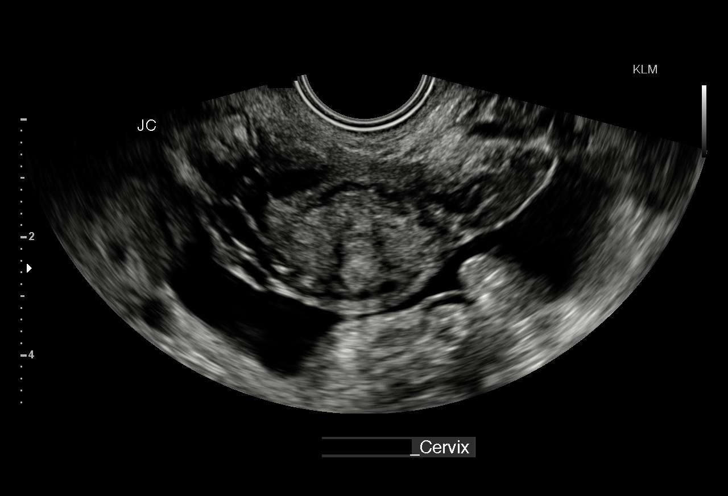
[im 49/70]
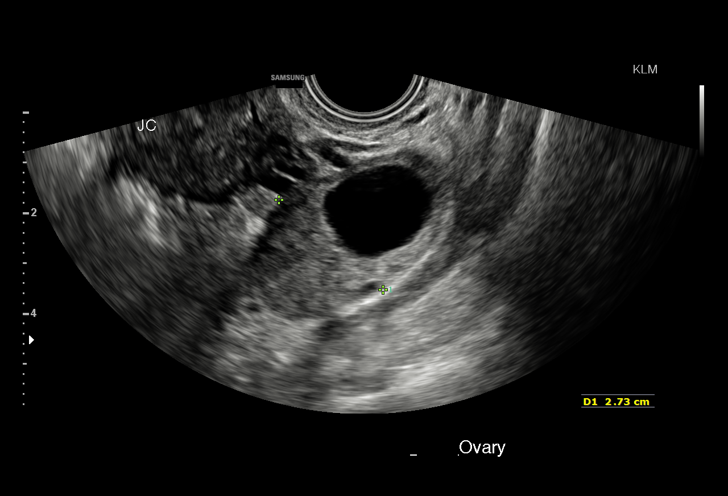
[im 55/70]
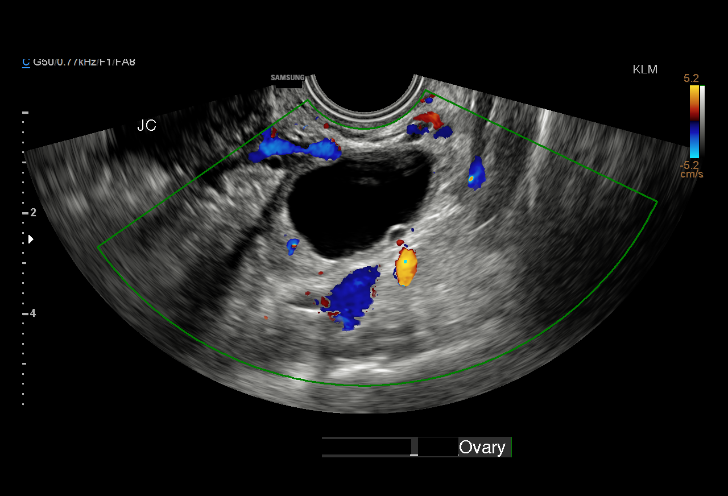
[im 58/70]
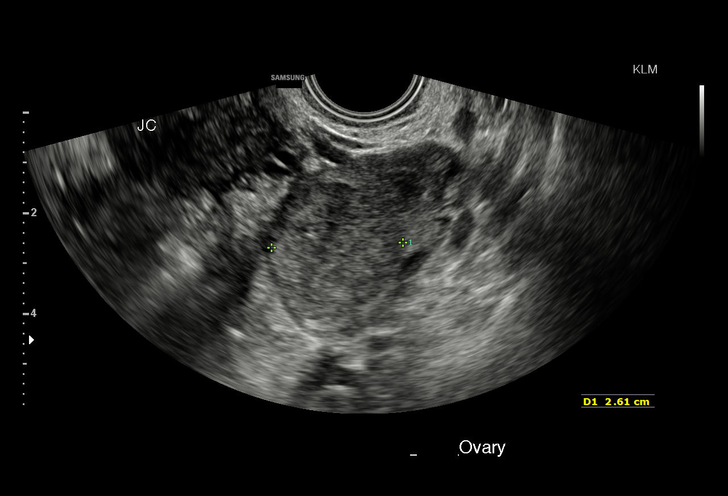
[im 64/70]
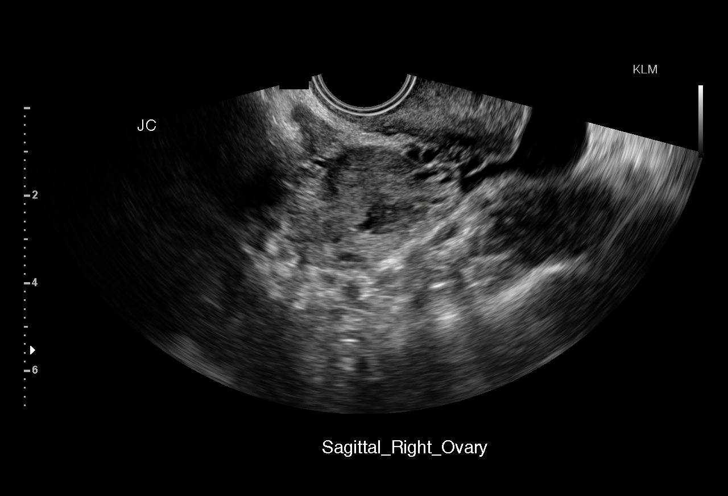
[im 70/70]
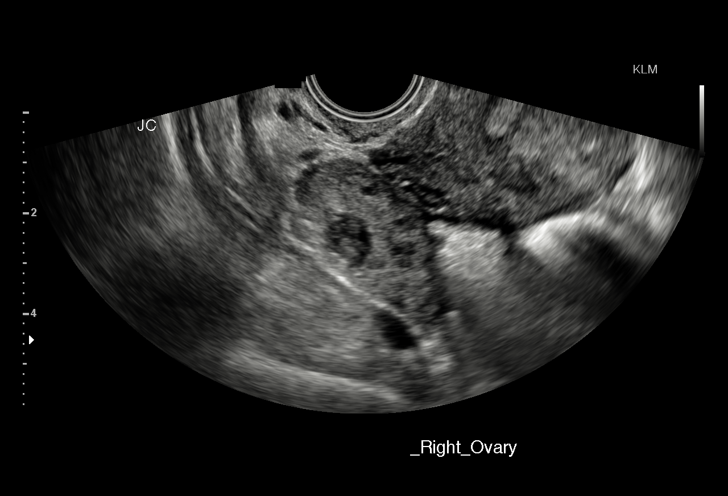

[15 of 25 positions shown; findings below may reference images not displayed]

FINDINGS: Uterus

Measurements: 7.5 x 3.2 x 6.5 cm. No fibroids or other mass
visualized.

Endometrium

Thickness: 4 mm.  No focal abnormality visualized.

Right ovary

Measurements: 3.9 x 2.1 x 1.8 cm. Normal appearance/no adnexal mass.

Left ovary

Measurements: 5.7 x 3.1 x 2.7 cm. There is a 2.4 x 1.9 x 2.6 cm
subtle left ovarian mass which is isoechoic to the remainder of the
ovarian parenchyma without internal Doppler flow weight may reflect
a hemorrhagic cyst or endometrioma.

Pulsed Doppler evaluation of both ovaries demonstrates normal
low-resistance arterial and venous waveforms.

Other findings

Trace pelvic free fluid likely physiologic.
IMPRESSION: 1. No ovarian torsion.
2. There is a 2.4 x 1.9 x 2.6 cm subtle left ovarian mass which is
isoechoic to the remainder of the ovarian parenchyma without
internal Doppler flow weight may reflect a hemorrhagic cyst or
endometrioma. Recommend follow-up pelvic ultrasound in 6-8 weeks.

## 2024-04-28 ENCOUNTER — Ambulatory Visit (INDEPENDENT_AMBULATORY_CARE_PROVIDER_SITE_OTHER): Payer: Self-pay | Admitting: Nurse Practitioner

## 2024-04-28 ENCOUNTER — Encounter: Payer: Self-pay | Admitting: Nurse Practitioner

## 2024-04-28 VITALS — BP 126/84 | HR 77 | Temp 98.4°F | Resp 16 | Ht 69.0 in | Wt 149.2 lb

## 2024-04-28 DIAGNOSIS — E782 Mixed hyperlipidemia: Secondary | ICD-10-CM

## 2024-04-28 DIAGNOSIS — E559 Vitamin D deficiency, unspecified: Secondary | ICD-10-CM

## 2024-04-28 DIAGNOSIS — F900 Attention-deficit hyperactivity disorder, predominantly inattentive type: Secondary | ICD-10-CM

## 2024-04-28 MED ORDER — AMPHETAMINE-DEXTROAMPHETAMINE 30 MG PO TABS: 30.0000 mg | ORAL_TABLET | Freq: Two times a day (BID) | ORAL | 0 refills | Status: DC | PRN

## 2024-04-28 MED ORDER — VITAMIN D (ERGOCALCIFEROL) 1.25 MG (50000 UNIT) PO CAPS: 50000.0000 [IU] | ORAL_CAPSULE | ORAL | 3 refills | Status: AC

## 2024-04-28 NOTE — Progress Notes (Signed)
 [facility] [address] [place], [place] [address]  Internal MEDICINE  Office Visit Note  Patient Name: Faith Bailey  [id number]  [id number]  Date of Service: 04/28/2024   Complaints/HPI Pt is here for establishment of PCP. Chief Complaint  Patient presents with   New Patient (Initial Visit)    Est care. Med refills     HPI Faith Bailey presents for a new patient visit to establish care.  Well-appearing 33 y.o. female with ADHD, hyperlipidemia, and low vitamin D .  Work: Emergency planning/management officer at [employer].  Home: live at a home with significant other Diet: fair  Exercise: walking Tobacco use: none  Alcohol use: occasionally, once or twice a week 1-3 drinks  Illicit drug use: none  Pap smear: done last year in August  Labs: done in march this year  New or worsening pain: none  Due for adderall refills, current dose remains effective. BP and heart rate are normal. Denies any palpitation or other adverse side effects of the medication.    Current Medication: Outpatient Encounter Medications as of 04/28/2024  Medication Sig   busPIRone (BUSPAR) 10 MG tablet Take 10 mg by mouth 3 (three) times daily.   Vitamin D , Ergocalciferol , (DRISDOL ) 1.25 MG (50000 UNIT) CAPS capsule Take 1 capsule (50,000 Units total) by mouth every 7 (seven) days.   [DISCONTINUED] ADDERALL 30 MG tablet 1 tablet Orally Twice a day; Duration: 30 days   amphetamine -dextroamphetamine  (ADDERALL) 30 MG tablet Take 1 tablet by mouth 2 (two) times daily as needed (ADHD symptoms).   [START ON 05/26/2024] amphetamine -dextroamphetamine  (ADDERALL) 30 MG tablet Take 1 tablet by mouth 2 (two) times daily as needed (ADHD symptoms).   [START ON 06/23/2024] amphetamine -dextroamphetamine  (ADDERALL) 30 MG tablet Take 1 tablet by mouth 2 (two) times daily as needed (ADHD symptoms).   [DISCONTINUED] ethynodiol-ethinyl estradiol  (ZOVIA 1/35E, 28,) 1-35 MG-MCG tablet One tab daily.   Office visit for more refills    [DISCONTINUED] ibuprofen  (ADVIL ,MOTRIN ) 600 MG tablet Take 1 tablet (600 mg total) by mouth every 6 (six) hours as needed. (Patient not taking: Reported on 04/28/2024)   [DISCONTINUED] ondansetron (ZOFRAN-ODT) 4 MG disintegrating tablet Take 4 mg by mouth every 8 (eight) hours as needed for nausea or vomiting. (Patient not taking: Reported on 04/28/2024)   No facility-administered encounter medications on file as of 04/28/2024.    Surgical History: Past Surgical History:  Procedure Laterality Date   WISDOM TOOTH EXTRACTION      Medical History: Past Medical History:  Diagnosis Date   MHA (microangiopathic hemolytic anemia) (HCC)    UTI (urinary tract infection)     Family History: Family History  Problem Relation Age of Onset   Diabetes Other    Hypertension Other    Heart disease Other     Social History   Socioeconomic History   Marital status: Single    Spouse name: Not on file   Number of children: Not on file   Years of education: Not on file   Highest education level: Not on file  Occupational History   Not on file  Tobacco Use   Smoking status: Never   Smokeless tobacco: Not on file  Substance and Sexual Activity   Alcohol use: Yes    Comment: occasional   Drug use: No   Sexual activity: Yes    Birth control/protection: Pill  Other Topics Concern   Not on file  Social History Narrative   Not on file   Social Drivers of  Health   Financial Resource Strain: Not on file  Food Insecurity: Not on file  Transportation Needs: Not on file  Physical Activity: Not on file  Stress: Not on file  Social Connections: Unknown (01/19/2022)   Received from [facility]   Social Network    Social Network: Not on file  Intimate Partner Violence: Unknown (12/11/2021)   Received from [facility]   HITS    Physically Hurt: Not on file    Insult or Talk Down To: Not on file    Threaten Physical Harm: Not on file    Scream or Curse: Not on file     Review of Systems   Constitutional:  Negative for chills, fatigue and unexpected weight change.  HENT:  Negative for congestion, postnasal drip, rhinorrhea, sneezing and sore throat.   Eyes:  Negative for redness.  Respiratory: Negative.  Negative for cough, chest tightness, shortness of breath and wheezing.   Cardiovascular: Negative.  Negative for chest pain and palpitations.  Gastrointestinal:  Negative for abdominal pain, constipation, diarrhea, nausea and vomiting.  Genitourinary:  Negative for dysuria and frequency.  Musculoskeletal: Negative.  Negative for arthralgias, back pain, joint swelling and neck pain.  Skin: Negative.  Negative for rash.  Neurological: Negative.  Negative for tremors and numbness.  Hematological:  Negative for adenopathy. Does not bruise/bleed easily.  Psychiatric/Behavioral:  Positive for decreased concentration. Negative for behavioral problems (Depression), self-injury, sleep disturbance and suicidal ideas. The patient is not nervous/anxious.     Vital Signs: BP 126/84   Pulse 77   Temp 98.4 F (36.9 C)   Resp 16   Ht 5' 9 (1.753 m)   Wt 149 lb 3.2 oz (67.7 kg)   SpO2 99%   BMI 22.03 kg/m    Physical Exam Vitals reviewed.  Constitutional:      General: She is not in acute distress.    Appearance: Normal appearance. She is normal weight. She is not ill-appearing.  HENT:     Head: Normocephalic and atraumatic.  Eyes:     Pupils: Pupils are equal, round, and reactive to light.  Cardiovascular:     Rate and Rhythm: Normal rate and regular rhythm.  Pulmonary:     Effort: Pulmonary effort is normal. No respiratory distress.  Neurological:     Mental Status: She is alert and oriented to person, place, and time.  Psychiatric:        Mood and Affect: Mood normal.        Behavior: Behavior normal.       Assessment/Plan: 1. Mixed hyperlipidemia (Primary) Limit red meat, increase lean protein in diet and increase physical activity as tolerated.  2. Vitamin  D deficiency Weekly vitamin D  supplement prescribed.  - Vitamin D , Ergocalciferol , (DRISDOL ) 1.25 MG (50000 UNIT) CAPS capsule; Take 1 capsule (50,000 Units total) by mouth every 7 (seven) days.  Dispense: 12 capsule; Refill: 3  3. ADHD (attention deficit hyperactivity disorder), inattentive type Continue adderall as prescribed, follow up in 3 months for additional refills  - amphetamine -dextroamphetamine  (ADDERALL) 30 MG tablet; Take 1 tablet by mouth 2 (two) times daily as needed (ADHD symptoms).  Dispense: 60 tablet; Refill: 0 - amphetamine -dextroamphetamine  (ADDERALL) 30 MG tablet; Take 1 tablet by mouth 2 (two) times daily as needed (ADHD symptoms).  Dispense: 60 tablet; Refill: 0 - amphetamine -dextroamphetamine  (ADDERALL) 30 MG tablet; Take 1 tablet by mouth 2 (two) times daily as needed (ADHD symptoms).  Dispense: 60 tablet; Refill: 0    General Counseling:  Faith Bailey verbalizes understanding of the findings of todays visit and agrees with plan of treatment. I have discussed any further diagnostic evaluation that may be needed or ordered today. We also reviewed her medications today. she has been encouraged to call the office with any questions or concerns that should arise related to todays visit.    No orders of the defined types were placed in this encounter.   Meds ordered this encounter  Medications   Vitamin D , Ergocalciferol , (DRISDOL ) 1.25 MG (50000 UNIT) CAPS capsule    Sig: Take 1 capsule (50,000 Units total) by mouth every 7 (seven) days.    Dispense:  12 capsule    Refill:  3    Fill new script today   amphetamine -dextroamphetamine  (ADDERALL) 30 MG tablet    Sig: Take 1 tablet by mouth 2 (two) times daily as needed (ADHD symptoms).    Dispense:  60 tablet    Refill:  0    Fill for August.   amphetamine -dextroamphetamine  (ADDERALL) 30 MG tablet    Sig: Take 1 tablet by mouth 2 (two) times daily as needed (ADHD symptoms).    Dispense:  60 tablet    Refill:  0    Fill  for september   amphetamine -dextroamphetamine  (ADDERALL) 30 MG tablet    Sig: Take 1 tablet by mouth 2 (two) times daily as needed (ADHD symptoms).    Dispense:  60 tablet    Refill:  0    Fill for october    Return in about 3 months (around 07/22/2024) for F/U, ADHD med check, Faith Bailey PCP.  Time spent:30 Minutes Time spent with patient included reviewing progress notes, labs, imaging studies, and discussing plan for follow up.   Faith Bailey Controlled Substance Database was reviewed by me for overdose risk score (ORS)   This patient was seen by Mardy Maxin, FNP-C in collaboration with Dr. Sigrid Bathe as a part of collaborative care agreement.   Kian Gamarra R. Maxin, MSN, FNP-C Internal Medicine

## 2024-05-08 ENCOUNTER — Encounter: Payer: Self-pay | Admitting: Nurse Practitioner

## 2024-07-27 ENCOUNTER — Encounter: Payer: Self-pay | Admitting: Nurse Practitioner

## 2024-07-27 ENCOUNTER — Ambulatory Visit: Payer: Self-pay | Admitting: Nurse Practitioner

## 2024-07-27 VITALS — BP 124/82 | HR 84 | Temp 97.8°F | Resp 16 | Ht 69.0 in | Wt 154.2 lb

## 2024-07-27 DIAGNOSIS — F900 Attention-deficit hyperactivity disorder, predominantly inattentive type: Secondary | ICD-10-CM

## 2024-07-27 DIAGNOSIS — Z79899 Other long term (current) drug therapy: Secondary | ICD-10-CM

## 2024-07-27 DIAGNOSIS — E559 Vitamin D deficiency, unspecified: Secondary | ICD-10-CM

## 2024-07-27 LAB — POCT URINE DRUG SCREEN
Methylenedioxyamphetamine: NOT DETECTED
POC Amphetamine UR: POSITIVE — AB
POC BENZODIAZEPINES UR: NOT DETECTED
POC Barbiturate UR: NOT DETECTED
POC Cocaine UR: NOT DETECTED
POC Ecstasy UR: NOT DETECTED
POC Marijuana UR: NOT DETECTED
POC Methadone UR: NOT DETECTED
POC Methamphetamine UR: NOT DETECTED
POC Opiate Ur: NOT DETECTED
POC Oxycodone UR: NOT DETECTED
POC PHENCYCLIDINE UR: NOT DETECTED
POC TRICYCLICS UR: NOT DETECTED

## 2024-07-27 MED ORDER — AMPHETAMINE-DEXTROAMPHETAMINE 30 MG PO TABS: 30.0000 mg | ORAL_TABLET | Freq: Two times a day (BID) | ORAL | 0 refills | Status: AC | PRN

## 2024-07-27 NOTE — Progress Notes (Signed)
 [facility] [address] [place], [place] [address]  Internal MEDICINE  Office Visit Note  Patient Name: Faith Bailey  [id number]  [id number]  Date of Service: 07/27/2024  Chief Complaint  Patient presents with   Follow-up    HPI Giara presents for a follow-up visit for ADHD, UDS and low vitamin D .   ADHD -- current dose remains effective. BP and heart rate are normal. She denies any palpitations or other adverse side effects of the medication.  Due for UDS today Vitamin D  deficiency -- taking weekly supplement     Current Medication: Outpatient Encounter Medications as of 07/27/2024  Medication Sig   [START ON 09/21/2024] amphetamine -dextroamphetamine  (ADDERALL) 30 MG tablet Take 1 tablet by mouth 2 (two) times daily as needed (ADHD symptoms).   [START ON 08/24/2024] amphetamine -dextroamphetamine  (ADDERALL) 30 MG tablet Take 1 tablet by mouth 2 (two) times daily as needed (ADHD symptoms).   amphetamine -dextroamphetamine  (ADDERALL) 30 MG tablet Take 1 tablet by mouth 2 (two) times daily as needed (ADHD symptoms).   busPIRone (BUSPAR) 10 MG tablet Take 10 mg by mouth 3 (three) times daily.   Vitamin D , Ergocalciferol , (DRISDOL ) 1.25 MG (50000 UNIT) CAPS capsule Take 1 capsule (50,000 Units total) by mouth every 7 (seven) days.   [DISCONTINUED] amphetamine -dextroamphetamine  (ADDERALL) 30 MG tablet Take 1 tablet by mouth 2 (two) times daily as needed (ADHD symptoms).   [DISCONTINUED] amphetamine -dextroamphetamine  (ADDERALL) 30 MG tablet Take 1 tablet by mouth 2 (two) times daily as needed (ADHD symptoms).   [DISCONTINUED] amphetamine -dextroamphetamine  (ADDERALL) 30 MG tablet Take 1 tablet by mouth 2 (two) times daily as needed (ADHD symptoms).   No facility-administered encounter medications on file as of 07/27/2024.    Surgical History: Past Surgical History:  Procedure Laterality Date   WISDOM TOOTH EXTRACTION      Medical History: Past Medical History:   Diagnosis Date   MHA (microangiopathic hemolytic anemia) (HCC)    UTI (urinary tract infection)     Family History: Family History  Problem Relation Age of Onset   Diabetes Other    Hypertension Other    Heart disease Other     Social History   Socioeconomic History   Marital status: Single    Spouse name: Not on file   Number of children: Not on file   Years of education: Not on file   Highest education level: Not on file  Occupational History   Not on file  Tobacco Use   Smoking status: Never   Smokeless tobacco: Not on file  Substance and Sexual Activity   Alcohol use: Yes    Comment: occasional   Drug use: No   Sexual activity: Yes    Birth control/protection: Pill  Other Topics Concern   Not on file  Social History Narrative   Not on file   Social Drivers of Health   Financial Resource Strain: Not on file  Food Insecurity: Not on file  Transportation Needs: Not on file  Physical Activity: Not on file  Stress: Not on file  Social Connections: Unknown (01/19/2022)   Received from [facility]   Social Network    Social Network: Not on file  Intimate Partner Violence: Unknown (12/11/2021)   Received from [facility]   HITS    Physically Hurt: Not on file    Insult or Talk Down To: Not on file    Threaten Physical Harm: Not on file    Scream or Curse: Not on file  Review of Systems  Constitutional:  Negative for chills, fatigue and unexpected weight change.  HENT:  Negative for congestion, postnasal drip, rhinorrhea, sneezing and sore throat.   Eyes:  Negative for redness.  Respiratory: Negative.  Negative for cough, chest tightness, shortness of breath and wheezing.   Cardiovascular: Negative.  Negative for chest pain and palpitations.  Gastrointestinal:  Negative for abdominal pain, constipation, diarrhea, nausea and vomiting.  Genitourinary:  Negative for dysuria and frequency.  Musculoskeletal: Negative.  Negative for arthralgias, back  pain, joint swelling and neck pain.  Skin: Negative.  Negative for rash.  Neurological: Negative.  Negative for tremors and numbness.  Hematological:  Negative for adenopathy. Does not bruise/bleed easily.  Psychiatric/Behavioral:  Positive for decreased concentration. Negative for behavioral problems (Depression), self-injury, sleep disturbance and suicidal ideas. The patient is not nervous/anxious.     Vital Signs: BP 124/82   Pulse 84   Temp 97.8 F (36.6 C)   Resp 16   Ht 5' 9 (1.753 m)   Wt 154 lb 3.2 oz (69.9 kg)   SpO2 97%   BMI 22.77 kg/m    Physical Exam Vitals reviewed.  Constitutional:      General: She is not in acute distress.    Appearance: Normal appearance. She is normal weight. She is not ill-appearing.  HENT:     Head: Normocephalic and atraumatic.  Eyes:     Pupils: Pupils are equal, round, and reactive to light.  Cardiovascular:     Rate and Rhythm: Normal rate and regular rhythm.  Pulmonary:     Effort: Pulmonary effort is normal. No respiratory distress.  Neurological:     Mental Status: She is alert and oriented to person, place, and time.  Psychiatric:        Mood and Affect: Mood normal.        Behavior: Behavior normal.        Assessment/Plan: 1. Vitamin D  deficiency (Primary) Continue weekly supplement as prescribed.   2. High risk medications (not anticoagulants) long-term use UDS done today  - POCT Urine Drug Screen  3. ADHD (attention deficit hyperactivity disorder), inattentive type Continue adderall as prescribed, follow up in 3 months for additional refills.  - amphetamine -dextroamphetamine  (ADDERALL) 30 MG tablet; Take 1 tablet by mouth 2 (two) times daily as needed (ADHD symptoms).  Dispense: 60 tablet; Refill: 0 - amphetamine -dextroamphetamine  (ADDERALL) 30 MG tablet; Take 1 tablet by mouth 2 (two) times daily as needed (ADHD symptoms).  Dispense: 60 tablet; Refill: 0 - amphetamine -dextroamphetamine  (ADDERALL) 30 MG tablet;  Take 1 tablet by mouth 2 (two) times daily as needed (ADHD symptoms).  Dispense: 60 tablet; Refill: 0   General Counseling: Jalana verbalizes understanding of the findings of todays visit and agrees with plan of treatment. I have discussed any further diagnostic evaluation that may be needed or ordered today. We also reviewed her medications today. she has been encouraged to call the office with any questions or concerns that should arise related to todays visit.    Orders Placed This Encounter  Procedures   POCT Urine Drug Screen    Meds ordered this encounter  Medications   amphetamine -dextroamphetamine  (ADDERALL) 30 MG tablet    Sig: Take 1 tablet by mouth 2 (two) times daily as needed (ADHD symptoms).    Dispense:  60 tablet    Refill:  0    Fill for january   amphetamine -dextroamphetamine  (ADDERALL) 30 MG tablet    Sig: Take 1 tablet by mouth 2 (two)  times daily as needed (ADHD symptoms).    Dispense:  60 tablet    Refill:  0    Fill for december   amphetamine -dextroamphetamine  (ADDERALL) 30 MG tablet    Sig: Take 1 tablet by mouth 2 (two) times daily as needed (ADHD symptoms).    Dispense:  60 tablet    Refill:  0    Fill for november    Return in about 3 months (around 10/20/2024) for F/U, ADHD med check, Lusero Nordlund PCP.   Total time spent:30 Minutes Time spent includes review of chart, medications, test results, and follow up plan with the patient.   [place] Controlled Substance Database was reviewed by me.  This patient was seen by Mardy Maxin, FNP-C in collaboration with Dr. Sigrid Bathe as a part of collaborative care agreement.   Murle Otting R. Maxin, MSN, FNP-C Internal medicine

## 2024-07-29 ENCOUNTER — Ambulatory Visit: Payer: Self-pay | Admitting: Nurse Practitioner

## 2024-08-05 ENCOUNTER — Encounter: Payer: Self-pay | Admitting: Nurse Practitioner

## 2024-10-19 ENCOUNTER — Ambulatory Visit: Payer: Self-pay | Admitting: Nurse Practitioner
